# Patient Record
Sex: Male | Born: 1965 | Race: White | Hispanic: No | Marital: Married | Smoking: Never smoker
Health system: Southern US, Community
[De-identification: ages and names within clinical notes are randomized; demographics above are authoritative.]

## PROBLEM LIST (undated history)

## (undated) DIAGNOSIS — F419 Anxiety disorder, unspecified: Secondary | ICD-10-CM

## (undated) DIAGNOSIS — I10 Essential (primary) hypertension: Secondary | ICD-10-CM

## (undated) HISTORY — PX: HEMORRHOID SURGERY: SHX153

## (undated) HISTORY — PX: APPENDECTOMY: SHX54

## (undated) HISTORY — PX: HERNIA REPAIR: SHX51

---

## 2006-01-05 ENCOUNTER — Emergency Department: Payer: Self-pay | Admitting: Emergency Medicine

## 2006-02-04 ENCOUNTER — Emergency Department: Payer: Self-pay | Admitting: Emergency Medicine

## 2007-01-11 ENCOUNTER — Emergency Department: Payer: Self-pay | Admitting: Internal Medicine

## 2015-12-19 ENCOUNTER — Encounter: Payer: Self-pay | Admitting: Emergency Medicine

## 2015-12-19 DIAGNOSIS — W57XXXA Bitten or stung by nonvenomous insect and other nonvenomous arthropods, initial encounter: Secondary | ICD-10-CM | POA: Insufficient documentation

## 2015-12-19 DIAGNOSIS — L03011 Cellulitis of right finger: Secondary | ICD-10-CM | POA: Insufficient documentation

## 2015-12-19 DIAGNOSIS — Y9389 Activity, other specified: Secondary | ICD-10-CM | POA: Diagnosis not present

## 2015-12-19 DIAGNOSIS — S60464A Insect bite (nonvenomous) of right ring finger, initial encounter: Secondary | ICD-10-CM | POA: Diagnosis present

## 2015-12-19 DIAGNOSIS — Y990 Civilian activity done for income or pay: Secondary | ICD-10-CM | POA: Diagnosis not present

## 2015-12-19 DIAGNOSIS — Y929 Unspecified place or not applicable: Secondary | ICD-10-CM | POA: Insufficient documentation

## 2015-12-19 DIAGNOSIS — I10 Essential (primary) hypertension: Secondary | ICD-10-CM | POA: Insufficient documentation

## 2015-12-19 NOTE — ED Triage Notes (Addendum)
Patient ambulatory to triage with steady gait, without difficulty or distress noted; pt reports ?bite to right 4th finger; redness & swelling noted; st occurred while at work [employer]--workers comp profile indicates testing only upon request; pt accomp by supervisor Milas GainKunyealo Gray who st not testing required)

## 2015-12-20 ENCOUNTER — Emergency Department
Admission: EM | Admit: 2015-12-20 | Discharge: 2015-12-20 | Disposition: A | Discharge status: Home / Self Care | Payer: Worker's Compensation | Attending: Emergency Medicine | Admitting: Emergency Medicine

## 2015-12-20 DIAGNOSIS — L03011 Cellulitis of right finger: Secondary | ICD-10-CM

## 2015-12-20 DIAGNOSIS — W57XXXA Bitten or stung by nonvenomous insect and other nonvenomous arthropods, initial encounter: Secondary | ICD-10-CM

## 2015-12-20 HISTORY — DX: Essential (primary) hypertension: I10

## 2015-12-20 MED ORDER — CLINDAMYCIN HCL 300 MG PO CAPS: 300.0000 mg | ORAL_CAPSULE | Freq: Three times a day (TID) | ORAL | 0 refills | Status: AC

## 2015-12-20 MED ORDER — CLINDAMYCIN HCL 150 MG PO CAPS
300.0000 mg | ORAL_CAPSULE | Freq: Once | ORAL | Status: AC
  Administered 2015-12-20: 300 mg via ORAL
  Filled 2015-12-20: qty 2

## 2015-12-20 NOTE — Discharge Instructions (Signed)
Please follow up with your primary care physician and return with worsening redness, worsening swelling, fever or inability to take care of antibiotics.

## 2015-12-20 NOTE — ED Provider Notes (Signed)
[facility] Emergency Department Provider Note   ____________________________________________   First MD Initiated Contact with Patient 12/20/15 [phone]     (approximate)  I have reviewed the triage vital signs and the nursing notes.   HISTORY  Chief Complaint Insect Bite    HPI Benjamin Cooper is a 50 y.o. male who comes into the hospital today with redness to his right hand. The patient reports that he noticed that at work today. He also reports that he actually noticed it a little bit yesterday it was not this bad. The patient thinks he might of been bitten by a spider. The patient reports that it feels like his fingers broken although he didn't do anything to break his finger. The patient denies any trauma. 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 The patient reports though that the pain is not that bad and rates it a 1 out of 10 in intensity. The patient denies any fevers, nausea, vomiting. The patient came in from work for evaluation.   Past Medical History:  Diagnosis Date  . Hypertension     There are no active problems to display for this patient.   Past Surgical History:  Procedure Laterality Date  . APPENDECTOMY    . HEMORRHOID SURGERY    . HERNIA REPAIR      Prior to Admission medications   Medication Sig Start Date End Date Taking? Authorizing Provider  clindamycin (CLEOCIN) 300 MG capsule Take 1 capsule (300 mg total) by mouth 3 (three) times daily. 12/20/15 12/30/15  Rebecka ApleyAllison P Raydin Bielinski, MD    Allergies Patient has no known allergies.  No family history on file.  Social History Social History  Substance Use Topics  . Smoking status: Never Smoker  . Smokeless tobacco: Never Used  . Alcohol use No    Review of Systems Constitutional: No fever/chills Eyes: No visual changes. ENT: No sore throat. Cardiovascular: Denies chest pain. Respiratory: Denies shortness of breath. Gastrointestinal: No  abdominal pain.  No nausea, no vomiting.  No diarrhea.  No constipation. Genitourinary: Negative for dysuria. Musculoskeletal: Negative for back pain. Skin: Right hand swelling and redness Neurological: Negative for headaches, focal weakness or numbness.  10-point ROS otherwise negative.  ____________________________________________   PHYSICAL EXAM:  VITAL SIGNS: ED Triage Vitals  Enc Vitals Group     BP 12/19/15 2340 (!) 164/90     Pulse Rate 12/19/15 2340 87     Resp 12/19/15 2340 20     Temp 12/19/15 2340 97.5 F (36.4 C)     Temp Source 12/19/15 2340 Oral     SpO2 12/19/15 2340 100 %     Weight 12/19/15 2341 200 lb (90.7 kg)     Height 12/19/15 2341 5\' 8"  (1.727 m)     Head Circumference --      Peak Flow --      Pain Score 12/19/15 2340 2     Pain Loc --      Pain Edu? --      Excl. in GC? --     Constitutional: Alert and oriented. Well appearing and in Mild distress. Eyes: Conjunctivae are normal. PERRL. EOMI. Head: Atraumatic. Nose: No congestion/rhinnorhea. Mouth/Throat: Mucous membranes are moist.  Oropharynx non-erythematous. Cardiovascular: Normal rate, regular rhythm. Grossly normal heart sounds.  Good peripheral circulation. Respiratory: Normal respiratory effort.  No retractions. Lungs CTAB. Gastrointestinal: Soft and nontender. No distention.  Musculoskeletal: Redness to right proximal fourth phalanx and over  the MCPs. Some mild soft tissue swelling up to the fourth PIP. No distal swelling, no flexor tendon tenderness, no sausage digit. Range of motion intact. Abrasion noted to mid proximal phalanx Neurologic:  Normal speech and language.  Skin:  Redness to right fourth digit over the MCP and proximal phalanx. Psychiatric: Mood and affect are normal.   ____________________________________________   LABS (all labs ordered are listed, but only abnormal results are displayed)  Labs Reviewed - No data to  display ____________________________________________  EKG  none ____________________________________________  RADIOLOGY  none ____________________________________________   PROCEDURES  Procedure(s) performed: None  Procedures  Critical Care performed: No  ____________________________________________   INITIAL IMPRESSION / ASSESSMENT AND PLAN / ED COURSE  Pertinent labs & imaging results that were available during my care of the patient were reviewed by me and considered in my medical decision making (see chart for details).  This is a 50 year old male who comes into the hospital today with some redness over his right fourth digit. The patient has an abrasion to the mid proximal phalanx which is concerning for a bug bite. The patient thinks that he might have been bitten by a spider. He did not see anything at work and he reports that this did start yesterday. I will give the patient a dose of clindamycin and instructed him that should the swelling got worse or the redness get worse in the next few days he should return to the emergency Department. Otherwise the patient's pain is minimal at this time. He will be discharged home to follow-up with his primary care physician.  Clinical Course      ____________________________________________   FINAL CLINICAL IMPRESSION(S) / ED DIAGNOSES  Final diagnoses:  Cellulitis of finger of right hand  Insect bite, initial encounter      NEW MEDICATIONS STARTED DURING THIS VISIT:  New Prescriptions   CLINDAMYCIN (CLEOCIN) 300 MG CAPSULE    Take 1 capsule (300 mg total) by mouth 3 (three) times daily.     Note:  This document was prepared using Dragon voice recognition software and may include unintentional dictation errors.    Rebecka ApleyAllison P Ugochukwu Chichester, MD 12/20/15 [phone]

## 2015-12-20 NOTE — ED Notes (Signed)
See triage note...pt sts that he noticed itching at site yesterday, but stated swelling happened 11/13.  Pt sts "not sure what bit me".  Redness and swelling noted to area, redness radiates past R wrist.  Sensation, circulation, motor function intact.  NAD.  Pt denies CP, SOB, N/V/D, fevers or LOC.

## 2015-12-22 ENCOUNTER — Emergency Department
Admission: EM | Admit: 2015-12-22 | Discharge: 2015-12-22 | Disposition: A | Discharge status: Home / Self Care | Payer: Worker's Compensation | Attending: Student in an Organized Health Care Education/Training Program | Admitting: Student in an Organized Health Care Education/Training Program

## 2015-12-22 ENCOUNTER — Encounter: Payer: Self-pay | Admitting: Emergency Medicine

## 2015-12-22 ENCOUNTER — Ambulatory Visit (INDEPENDENT_AMBULATORY_CARE_PROVIDER_SITE_OTHER): Payer: Worker's Compensation

## 2015-12-22 ENCOUNTER — Encounter: Payer: Self-pay | Admitting: *Deleted

## 2015-12-22 ENCOUNTER — Ambulatory Visit
Admission: EM | Admit: 2015-12-22 | Discharge: 2015-12-22 | Disposition: A | Discharge status: Home / Self Care | Payer: Worker's Compensation | Attending: Family Medicine | Admitting: Family Medicine

## 2015-12-22 DIAGNOSIS — I10 Essential (primary) hypertension: Secondary | ICD-10-CM | POA: Insufficient documentation

## 2015-12-22 DIAGNOSIS — L0291 Cutaneous abscess, unspecified: Secondary | ICD-10-CM

## 2015-12-22 DIAGNOSIS — L0390 Cellulitis, unspecified: Secondary | ICD-10-CM | POA: Diagnosis not present

## 2015-12-22 DIAGNOSIS — L03011 Cellulitis of right finger: Secondary | ICD-10-CM | POA: Insufficient documentation

## 2015-12-22 DIAGNOSIS — M79644 Pain in right finger(s): Secondary | ICD-10-CM | POA: Diagnosis present

## 2015-12-22 MED ORDER — CEPHALEXIN 500 MG PO CAPS: 500.0000 mg | ORAL_CAPSULE | Freq: Three times a day (TID) | ORAL | 0 refills | Status: DC

## 2015-12-22 MED ORDER — IBUPROFEN 800 MG PO TABS: 800.0000 mg | ORAL_TABLET | Freq: Three times a day (TID) | ORAL | 0 refills | Status: DC | PRN

## 2015-12-22 NOTE — ED Notes (Signed)
This RN spoke with Jami, PA-C, states okay to come back to flex.

## 2015-12-22 NOTE — ED Triage Notes (Signed)
Right ring finger, possible insect bite / abcess x several days. Site is deep red with significant edema.

## 2015-12-22 NOTE — ED Triage Notes (Signed)
Pt presents to ED with c/o worsening of his R 4th finger. Pt was seen here approx 2 days ago and was dx with cellulitis and was sent home for prescription for Clindamycin. Pt states finger is looking better than it did a few days ago. Pt states went to Urgent Care today and was told he had an abscess forming on his R 4th finger. Pt states has already filed worker's comp for this injury.

## 2015-12-22 NOTE — ED Notes (Signed)
Right ring finger red and swollen  States his hand was swollen 2 days ago

## 2015-12-22 NOTE — ED Provider Notes (Signed)
[facility] ____________________________________________  Time seen: Approximately 4:04 PM  I have reviewed the triage vital signs and the nursing notes.   HISTORY  Chief Complaint Abscess   HPI Benjamin Cooper is a 50 y.o. male presenting for the follow-up of redness and swelling to his right fourth finger. Patient reports that this past Monday he was at work and noticed an area to his right fourth finger that may have been an insect bite, with some redness that quickly progressed. Patient reports that he was seen in the emergency room Monday night and was started on antibiotics. Patient reports that he has been taking the antibiotics since being seen, but reports continued redness and swelling to the right fourth finger. Patient states his work wanted him to be seen in urgent care today for follow-up as this is being considered a workers Management consultantcompensation injury.  Patient states the pain is mild at this time. Patient reports tightness and discomfort and right proximal fourth finger. Patient denies any direct injury or,, but reports he does lift a lot of boxes daily at work. Denies any numbness or paresthesias. Reports some discomfort when trying to make a full fist. Reports right-handed. Patient states that the swelling overall has improved, further stating that he no longer has swelling in his hand, but reports the right fourth finger swelling has persisted.  Denies accompanying fevers, other skin changes, vomiting, nausea, diarrhea, weakness or other complaints. Patient reports otherwise feels well.  States he thinks tetanus immunization was updated in ER.      Past Medical History:  Diagnosis Date  . Hypertension     There are no active problems to display for this patient.   Past Surgical History:  Procedure Laterality Date  . APPENDECTOMY    . HEMORRHOID SURGERY    . HERNIA REPAIR      Current Outpatient Rx  . Order #: [id number] Class: Print  . Order #:  [id number] Class: Print  . Order #: [id number] Class: Print    No current facility-administered medications for this encounter.   Current Outpatient Prescriptions:  .  cephALEXin (KEFLEX) 500 MG capsule, Take 1 capsule (500 mg total) by mouth 3 (three) times daily., Disp: 21 capsule, Rfl: 0 .  clindamycin (CLEOCIN) 300 MG capsule, Take 1 capsule (300 mg total) by mouth 3 (three) times daily., Disp: 30 capsule, Rfl: 0 .  ibuprofen (ADVIL,MOTRIN) 800 MG tablet, Take 1 tablet (800 mg total) by mouth every 8 (eight) hours as needed (pain)., Disp: 30 tablet, Rfl: 0  Allergies Patient has no known allergies.  History reviewed. No pertinent family history.  Social History Social History  Substance Use Topics  . Smoking status: Never Smoker  . Smokeless tobacco: Never Used  . Alcohol use No    Review of Systems Constitutional: No fever/chills Eyes: No visual changes. ENT: No sore throat. Cardiovascular: Denies chest pain. Respiratory: Denies shortness of breath. Gastrointestinal: No abdominal pain.  No nausea, no vomiting.  No diarrhea.  No constipation. Genitourinary: Negative for dysuria. Musculoskeletal: Negative for back pain. Skin: Negative for rash.As above. Neurological: Negative for headaches, focal weakness or numbness.  10-point ROS otherwise negative.  ____________________________________________   PHYSICAL EXAM:  VITAL SIGNS: ED Triage Vitals  Enc Vitals Group     BP 12/22/15 1527 132/87     Pulse Rate 12/22/15 1527 74     Resp 12/22/15 1527 16     Temp 12/22/15 1527 99.2 F (37.3 C)     Temp Source 12/22/15 1527 Oral  SpO2 12/22/15 1527 99 %     Weight 12/22/15 1528 189 lb (85.7 kg)     Height 12/22/15 1528 5\' 7"  (1.702 m)     Head Circumference --      Peak Flow --      Pain Score --      Pain Loc --      Pain Edu? --      Excl. in GC? --     Constitutional: Alert and oriented. Well appearing and in no acute distress. Eyes: Conjunctivae are normal.  PERRL. EOMI. ENT      Head: Normocephalic and atraumatic.      Mouth/Throat: Mucous membranes are moist. Cardiovascular: Normal rate, regular rhythm. Grossly normal heart sounds.  Good peripheral circulation. Respiratory: Normal respiratory effort without tachypnea nor retractions. Breath sounds are clear and equal bilaterally. No wheezes/rales/rhonchi.. Musculoskeletal:  Ambulatory with steady gait. No midline cervical, thoracic or lumbar tenderness to palpation.  Neurologic:  Normal speech and language. No gross focal neurologic deficits are appreciated. Speech is normal. No gait instability.  Skin:  Skin is warm, dry and intact. No rash noted. Except: Right fourth digit proximal phalanx with moderate erythema, moderate dorsal swelling, superficial pustule noted, no fluctuance, no active drainage, normal distal sensation and capillary refill, slightly weakened right fourth digit flexion and extension but full range of motion present, right hand otherwise nontender. Psychiatric: Mood and affect are normal. Speech and behavior are normal. Patient exhibits appropriate insight and judgment   ___________________________________________   LABS (all labs ordered are listed, but only abnormal results are displayed)  Labs Reviewed - No data to display ____________________________________________  RADIOLOGY  Dg Finger Ring Right  Result Date: 12/22/2015 CLINICAL DATA:  50 y/o  M; insect bite and abscess for several days. EXAM: RIGHT RING FINGER 2+V COMPARISON:  None. FINDINGS: There is no evidence of fracture or dislocation. There is no evidence of arthropathy or other focal bone abnormality. Soft tissue swelling about the proximal fourth digit. IMPRESSION: No evidence of arthropathy or other focal bone abnormality. Electronically Signed   By: Mitzi Hansen M.D.   On: 12/22/2015 16:40   ____________________________________________   PROCEDURES Procedures    INITIAL IMPRESSION /  ASSESSMENT AND PLAN / ED COURSE  Pertinent labs & imaging results that were available during my care of the patient were reviewed by me and considered in my medical decision making (see chart for details).  Overall well-appearing patient. Patient not best historian. Presents for follow-up of redness and swelling to right proximal fourth digit. Unknown if trauma versus break in skin versus insect bite initiating cellulitis. Seen in the emergency room and had initial treatment. Patient taking clindamycin for 48 hours now with continued redness and swelling. Discussed in detail with patient as concerned for persisted cellulitis as well as with slightly weakened flexion and extension, patient may need to be seen  IV antibiotics for continued treatment.  In comparing patient presentation with ER physicians description of wound, concern for no improvement. Also in reviewing the x-ray appearance of subcutaneous gas present, but not mentioned by radiologist. As concerned for failed outpatient therapy, recommended for patient to be further seen in the emergency room at this time. Information for given to patient for Tommie Maximiano Coss for further follow-up post ER visit.  Discussed follow up with Primary care physician this week. Discussed follow up and return parameters including no resolution or any worsening concerns. Patient verbalized understanding and agreed to plan.   Discussed patient and  plan of care with Dr Thurmond ButtsWade who also saw patient and agrees with plan of care.   ____________________________________________   FINAL CLINICAL IMPRESSION(S) / ED DIAGNOSES  Final diagnoses:  Cellulitis, unspecified cellulitis site  Abscess     Discharge Medication List as of 12/22/2015  4:50 PM      Note: This dictation was prepared with Dragon dictation along with smaller phrase technology. Any transcriptional errors that result from this process are unintentional.    Clinical Course       Renford DillsLindsey  Jeniffer Culliver, NP 12/22/15 1812

## 2015-12-22 NOTE — Discharge Instructions (Signed)
Go directly to emergency room as discussed.   Then follow up with Benjamin Cooper as directed. Call to schedule.

## 2015-12-22 NOTE — ED Provider Notes (Signed)
[facility] Emergency Department Provider Note  ____________________________________________  Time seen: Approximately 6:09 PM  I have reviewed the triage vital signs and the nursing notes.   HISTORY  Chief Complaint Hand Pain    HPI Benjamin Cooper is a 50 y.o. male , NAD, presents to the emergency department for follow-up of cellulitis. Patient states he was seen in this emergency department a few days ago for cellulitis affecting his right fourth finger, right hand and wrist. States since that time he has been taking his oral clindamycin as prescribed and notes decreased pain, swelling and redness. Also notes increased range of motion of the right wrist, hand and fingers since being on antibiotics. States he went to a local urgent care today for wound check in which x-rays were completed. He was told that an abscess was seen on the x-ray and was referred to this emergency department for follow-up. Patient denies any chest pain, shortness breath, abdominal pain, vomiting. Does state he gets nauseous if he takes his antibiotic without eating food with it. Has had no fevers, chills, body aches. Oozing, weeping or bleeding.   Past Medical History:  Diagnosis Date  . Hypertension     There are no active problems to display for this patient.   Past Surgical History:  Procedure Laterality Date  . APPENDECTOMY    . HEMORRHOID SURGERY    . HERNIA REPAIR      Prior to Admission medications   Medication Sig Start Date End Date Taking? Authorizing Provider  cephALEXin (KEFLEX) 500 MG capsule Take 1 capsule (500 mg total) by mouth 3 (three) times daily. 12/22/15   Jami L Hagler, PA-C  clindamycin (CLEOCIN) 300 MG capsule Take 1 capsule (300 mg total) by mouth 3 (three) times daily. 12/20/15 12/30/15  Rebecka Apley, MD  ibuprofen (ADVIL,MOTRIN) 800 MG tablet Take 1 tablet (800 mg total) by mouth every 8 (eight) hours as needed (pain). 12/22/15   Jami L Hagler,  PA-C    Allergies Patient has no known allergies.  History reviewed. No pertinent family history.  Social History Social History  Substance Use Topics  . Smoking status: Never Smoker  . Smokeless tobacco: Never Used  . Alcohol use No     Review of Systems  Constitutional: No fever/chills Cardiovascular: No chest pain. Respiratory: No shortness of breath. No wheezing.  Gastrointestinal: No abdominal pain.  No nausea, vomiting.  Musculoskeletal: Negative for Right wrist, hand or finger pain.  Skin: Positive redness and swelling and skin sore about the right ring finger. Negative for rash, oozing, weeping, bleeding. Neurological: Negative for numbness, weakness, tingling. 10-point ROS otherwise negative.  ____________________________________________   PHYSICAL EXAM:  VITAL SIGNS: ED Triage Vitals  Enc Vitals Group     BP 12/22/15 1741 (!) 142/89     Pulse Rate 12/22/15 1741 72     Resp 12/22/15 1741 18     Temp 12/22/15 1741 97.6 F (36.4 C)     Temp Source 12/22/15 1741 Oral     SpO2 12/22/15 1741 99 %     Weight 12/22/15 1742 200 lb (90.7 kg)     Height 12/22/15 1742 5\' 8"  (1.727 m)     Head Circumference --      Peak Flow --      Pain Score 12/22/15 1746 1     Pain Loc --      Pain Edu? --      Excl. in GC? --  Constitutional: Alert and oriented. Well appearing and in no acute distress. Eyes: Conjunctivae are normal Without icterus or injection Head: Atraumatic. Cardiovascular: Good peripheral circulation with 2+ pulses noted in the right upper extremity. Capillary refill is brisk in all digits of the right hand. Respiratory: Normal respiratory effort without tachypnea or retractions.  Musculoskeletal: Full range of motion of the right wrist, hand and fingers without difficulty or pain. Patient is able to make a fist and fully extend all digits without pain or difficulty. Neurologic:  Normal speech and language. No gross focal neurologic deficits are  appreciated.  Skin:  2 cm oblong area of erythematous and edematous tissue noted about the dorsal portion of the right fourth proximal phalanx with area of pale skin centrally. 2 mm annular scab is noted in this area. No active oozing, weeping or drainage. No pain to palpation of this area. No induration or fluctuance is noted. Skin is warm, dry and intact. No rash noted. Psychiatric: Mood and affect are normal. Speech and behavior are normal. Patient exhibits appropriate insight and judgement.   ____________________________________________   LABS  None ____________________________________________  EKG  None ____________________________________________  RADIOLOGY I, Hope PigeonJami L Hagler, personally viewed and evaluated these images (plain radiographs) as part of my medical decision making, as well as reviewing the written report by the radiologist.  Dg Finger Ring Right  Result Date: 12/22/2015 CLINICAL DATA:  50 y/o  M; insect bite and abscess for several days. EXAM: RIGHT RING FINGER 2+V COMPARISON:  None. FINDINGS: There is no evidence of fracture or dislocation. There is no evidence of arthropathy or other focal bone abnormality. Soft tissue swelling about the proximal fourth digit. IMPRESSION: No evidence of arthropathy or other focal bone abnormality. Electronically Signed   By: Mitzi HansenLance  Furusawa-Stratton M.D.   On: 12/22/2015 16:40   X-ray of the right ring finger above was completed at the most discomfort in urgent care. I personally evaluated the images and note no evidence of subcutaneous gas or abscess formation. Agree with radiology over read of soft tissue swelling and no arthropathy or other bone abnormality. ____________________________________________    PROCEDURES  Procedure(s) performed: None   Procedures   Medications - No data to display   ____________________________________________   INITIAL IMPRESSION / ASSESSMENT AND PLAN / ED COURSE  Pertinent labs &  imaging results that were available during my care of the patient were reviewed by me and considered in my medical decision making (see chart for details).  Clinical Course     Patient's diagnosis is consistent with cellulitis of right ring finger. Patient's cellulitis is vastly improved since his initial visit in this emergency department. No x-ray nor physical exam evidence of abscess is noted at this time. Patient will be discharged home with prescriptions for Keflex and ibuprofen to take along with his current prescription of clindamycin to ensure coverage of all potential bacteria causing the cellulitis. Patient is to follow up with patient advised to follow-up with his primary care provider or the urgent care of his choice for wound recheck in 2-3 days. Patient is also reminded to take all antibiotics with food to avoid any nausea or vomiting. Patient is given ED precautions to return to the ED for any worsening or new symptoms.    ____________________________________________  FINAL CLINICAL IMPRESSION(S) / ED DIAGNOSES  Final diagnoses:  Cellulitis of right ring finger      NEW MEDICATIONS STARTED DURING THIS VISIT:  New Prescriptions   CEPHALEXIN (KEFLEX) 500 MG CAPSULE  Take 1 capsule (500 mg total) by mouth 3 (three) times daily.   IBUPROFEN (ADVIL,MOTRIN) 800 MG TABLET    Take 1 tablet (800 mg total) by mouth every 8 (eight) hours as needed (pain).         Hope PigeonJami L Hagler, PA-C 12/22/15 1823    Willy EddyPatrick Robinson, MD 12/22/15 2110

## 2016-03-18 ENCOUNTER — Ambulatory Visit
Admission: EM | Admit: 2016-03-18 | Discharge: 2016-03-18 | Disposition: A | Discharge status: Home / Self Care | Payer: BLUE CROSS/BLUE SHIELD | Attending: Family Medicine | Admitting: Family Medicine

## 2016-03-18 ENCOUNTER — Encounter: Payer: Self-pay | Admitting: Gynecology

## 2016-03-18 DIAGNOSIS — L02512 Cutaneous abscess of left hand: Secondary | ICD-10-CM | POA: Diagnosis not present

## 2016-03-18 HISTORY — DX: Anxiety disorder, unspecified: F41.9

## 2016-03-18 MED ORDER — MUPIROCIN 2 % EX OINT: 1.0000 "application " | TOPICAL_OINTMENT | Freq: Three times a day (TID) | CUTANEOUS | 0 refills | Status: DC

## 2016-03-18 MED ORDER — SULFAMETHOXAZOLE-TRIMETHOPRIM 800-160 MG PO TABS: 1.0000 | ORAL_TABLET | Freq: Two times a day (BID) | ORAL | 0 refills | Status: DC

## 2016-03-18 NOTE — ED Provider Notes (Signed)
[facility]    CSN: [id number] Arrival date & time: 03/18/16  1333     History   Chief Complaint Chief Complaint  Patient presents with  . Abscess    HPI Benjamin Cooper is a 51 y.o. male.   Patient's 51 year old white male who is here because of infected left ring finger. States he thinks it became infected with using certain chemicals he is actually sure when it started but he thinks it started on Wednesday. He was unable to work on Saturday informed me that he came here but we will close apparently came after closing hours and if he thinks was about 5 or 6:00 when he got here. He's had pain in the left index finger and states is swollen. 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 Past smoker history has a history of anxiety and hypertension. He's had previous appendectomy hernia repair and hemorrhoid surgery. No known drug allergies. He has a son who also being treated for suspected MRSA infection as well. All the pertinent family medical history relevant to today's visit.   The history is provided by the patient. No language interpreter was used.  Abscess  Location:  Finger Finger abscess location:  L ring finger Abscess quality: fluctuance, induration, painful and redness   Red streaking: yes   Progression:  Worsening Pain details:    Quality:  Pressure, throbbing and sharp   Severity:  Moderate   Timing:  Constant   Progression:  Worsening Chronicity:  New Relieved by:  Nothing Worsened by:  Nothing   Past Medical History:  Diagnosis Date  . Anxiety   . Hypertension     There are no active problems to display for this patient.   Past Surgical History:  Procedure Laterality Date  . APPENDECTOMY    . HEMORRHOID SURGERY    . HERNIA REPAIR         Home Medications    Prior to Admission medications   Medication Sig Start Date End Date Taking? Authorizing Provider  ibuprofen (ADVIL,MOTRIN) 800 MG tablet Take 1 tablet (800 mg total) by mouth every 8 (eight) hours  as needed (pain). 12/22/15  Yes Jami L Hagler, PA-C  metoprolol succinate (TOPROL-XL) 50 MG 24 hr tablet Take 50 mg by mouth daily. Take with or immediately following a meal.   Yes Historical Provider, MD  PARoxetine (PAXIL) 20 MG tablet Take 20 mg by mouth daily.   Yes Historical Provider, MD  cephALEXin (KEFLEX) 500 MG capsule Take 1 capsule (500 mg total) by mouth 3 (three) times daily. 12/22/15   Jami L Hagler, PA-C  mupirocin ointment (BACTROBAN) 2 % Apply 1 application topically 3 (three) times daily. 03/18/16   Hassan Rowan, MD  sulfamethoxazole-trimethoprim (BACTRIM DS,SEPTRA DS) 800-160 MG tablet Take 1 tablet by mouth 2 (two) times daily. 03/18/16   Hassan Rowan, MD    Family History No family history on file.  Social History Social History  Substance Use Topics  . Smoking status: Never Smoker  . Smokeless tobacco: Never Used  . Alcohol use No     Allergies   Patient has no known allergies.   Review of Systems Review of Systems  Musculoskeletal: Positive for joint swelling.  Skin: Positive for color change and rash.  All other systems reviewed and are negative.    Physical Exam Triage Vital Signs ED Triage Vitals  Enc Vitals Group     BP 03/18/16 1440 132/80     Pulse Rate 03/18/16 1440 71  Resp 03/18/16 1440 16     Temp 03/18/16 1440 98.6 F (37 C)     Temp Source 03/18/16 1440 Oral     SpO2 03/18/16 1440 99 %     Weight 03/18/16 1443 189 lb (85.7 kg)     Height 03/18/16 1443 5\' 10"  (1.778 m)     Head Circumference --      Peak Flow --      Pain Score 03/18/16 1444 2     Pain Loc --      Pain Edu? --      Excl. in GC? --    No data found.   Updated Vital Signs BP 132/80 (BP Location: Left Arm)   Pulse 71   Temp 98.6 F (37 C) (Oral)   Resp 16   Ht 5\' 10"  (1.778 m)   Wt 189 lb (85.7 kg)   SpO2 99%   BMI 27.12 kg/m   Visual Acuity Right Eye Distance:   Left Eye Distance:   Bilateral Distance:    Right Eye Near:   Left Eye Near:      Bilateral Near:     Physical Exam  Constitutional: He is oriented to person, place, and time. He appears well-developed and well-nourished.  HENT:  Head: Normocephalic and atraumatic.  Eyes: Pupils are equal, round, and reactive to light.  Neck: Normal range of motion.  Pulmonary/Chest: Effort normal.  Musculoskeletal: Normal range of motion.  Neurological: He is alert and oriented to person, place, and time.  Skin: Rash noted. There is erythema.     Posterior left ring finger swollen red and inflamed. With some gentle pressure abscess opened up and copious bile drainage was elicited. Culture was obtained of the wound.  Psychiatric: He has a normal mood and affect.     UC Treatments / Results  Labs (all labs ordered are listed, but only abnormal results are displayed) Labs Reviewed  AEROBIC CULTURE (SUPERFICIAL SPECIMEN)    EKG  EKG Interpretation None       Radiology No results found.  Procedures Procedures (including critical care time)  Medications Ordered in UC Medications - No data to display   Initial Impression / Assessment and Plan / UC Course  I have reviewed the triage vital signs and the nursing notes.  Pertinent labs & imaging results that were available during my care of the patient were reviewed by me and considered in my medical decision making (see chart for details).    Place patient on Septra DS 1 tablet twice a day and Bactroban ointment to apply 2-3 times a day to the left ring finger. Giving him a work note for either Monday and or Monday and Tuesday dictating out tender finger. I strongly suggest return Wednesday for recheck and to be reevaluated but it finger does get worse I will also [facility] because he may need to see a hand surgeon if is getting worse to better explain to him that I'm not going to cut open a finger but since it successfully drained with some pressure will see if this will take care of things.  Final  Clinical Impressions(s) / UC Diagnoses   Final diagnoses:  Abscess of left ring finger    New Prescriptions New Prescriptions   MUPIROCIN OINTMENT (BACTROBAN) 2 %    Apply 1 application topically 3 (three) times daily.   SULFAMETHOXAZOLE-TRIMETHOPRIM (BACTRIM DS,SEPTRA DS) 800-160 MG TABLET    Take 1 tablet by mouth 2 (two)  times daily.    Note: This dictation was prepared with Dragon dictation along with smaller phrase technology. Any transcriptional errors that result from this process are unintentional.   Hassan Rowan, MD 03/18/16 [phone]

## 2016-03-18 NOTE — ED Triage Notes (Signed)
Patient with left ring finger abscess x 2 days ago.

## 2016-03-21 ENCOUNTER — Ambulatory Visit
Admission: EM | Admit: 2016-03-21 | Discharge: 2016-03-21 | Disposition: A | Discharge status: Home / Self Care | Payer: BLUE CROSS/BLUE SHIELD | Attending: Family Medicine | Admitting: Family Medicine

## 2016-03-21 DIAGNOSIS — L03012 Cellulitis of left finger: Secondary | ICD-10-CM

## 2016-03-21 LAB — AEROBIC CULTURE  (SUPERFICIAL SPECIMEN)

## 2016-03-21 LAB — AEROBIC CULTURE W GRAM STAIN (SUPERFICIAL SPECIMEN)

## 2016-03-21 NOTE — ED Triage Notes (Signed)
Patient complains of left ring finger abscess. Patient states that the area is looking better to him. Patient still has swelling and redness.

## 2016-03-21 NOTE — Discharge Instructions (Signed)
Continue oral and topical medication as prescribed. Rest. Drink plenty of fluids.Keep clean. Monitor closely.    Follow up with your primary care physician this week as needed. Return to Urgent care or Emergency room for increased redness, swelling, pain, new or worsening concerns.

## 2016-03-21 NOTE — ED Provider Notes (Signed)
[facility] ____________________________________________  Time seen: Approximately 08:40 AM  I have reviewed the triage vital signs and the nursing notes.   HISTORY  Chief Complaint Abscess (recheck)   HPI Benjamin Cooper is a 51 y.o. male  presenting for reevaluation of his abscess to left ring finger. Patient reports seen in urgent care 3 days ago for abscess. Patient states he was started on oral antibiotics as well as topical and has been taking them as prescribed. Patient reports tolerating antibiotics well. Patient reports he feels like the abscess has much improved. Patient denies any more drainage. States that the redness and swelling are much improved. Denies any paresthesias, decreased range of motion, weakness, other skin changes or pain. Denies fevers. Reports continues to eat and drink well. Patient reports he has had a history of similar in the past. 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  Denies chest pain, shortness of breath, abdominal pain, dysuria, extremity pain, extremity swelling or rash. Denies recent sickness. Denies recent antibiotic use. Reports tetanus immunization up-to-date.  [facility]: PCP   Past Medical History:  Diagnosis Date  . Anxiety   . Hypertension     There are no active problems to display for this patient.   Past Surgical History:  Procedure Laterality Date  . APPENDECTOMY    . HEMORRHOID SURGERY    . HERNIA REPAIR       No current facility-administered medications for this encounter.   Current Outpatient Prescriptions:  .  ibuprofen (ADVIL,MOTRIN) 800 MG tablet, Take 1 tablet (800 mg total) by mouth every 8 (eight) hours as needed (pain)., Disp: 30 tablet, Rfl: 0 .  metoprolol succinate (TOPROL-XL) 50 MG 24 hr tablet, Take 50 mg by mouth daily. Take with or immediately following a meal., Disp: ,  Rfl:  .  mupirocin ointment (BACTROBAN) 2 %, Apply 1 application topically 3 (three) times daily., Disp: 22 g, Rfl: 0 .  PARoxetine (PAXIL) 20 MG tablet, Take 20 mg by mouth daily., Disp: , Rfl:  .  sulfamethoxazole-trimethoprim (BACTRIM DS,SEPTRA DS) 800-160 MG tablet, Take 1 tablet by mouth 2 (two) times daily., Disp: 20 tablet, Rfl: 0 .  cephALEXin (KEFLEX) 500 MG capsule, Take 1 capsule (500 mg total) by mouth 3 (three) times daily., Disp: 21 capsule, Rfl: 0  Allergies Patient has no known allergies.  History reviewed. No pertinent family history.  Social History Social History  Substance Use Topics  . Smoking status: Never Smoker  . Smokeless tobacco: Never Used  . Alcohol use No    Review of Systems Constitutional: No fever/chills Eyes: No visual changes. ENT: No sore throat. Cardiovascular: Denies chest pain. Respiratory: Denies shortness of breath. Gastrointestinal: No abdominal pain.  No nausea, no vomiting.  No diarrhea.  No constipation. Genitourinary: Negative for dysuria. Musculoskeletal: Negative for back pain. Skin: Negative for rash. As above. Neurological: Negative for  focal weakness or numbness.  10-point ROS otherwise negative.  ____________________________________________   PHYSICAL EXAM:  VITAL SIGNS: ED Triage Vitals  Enc Vitals Group     BP 03/21/16 0828 (!) 132/91     Pulse Rate 03/21/16 0828 61     Resp 03/21/16 0828 18     Temp 03/21/16 0828 97.6 F (36.4 C)     Temp Source 03/21/16 0828 Oral     SpO2 03/21/16 0828 100 %     Weight 03/21/16 0826 189  lb (85.7 kg)     Height 03/21/16 0826 5\' 10"  (1.778 m)     Head Circumference --      Peak Flow --      Pain Score 03/21/16 0827 0     Pain Loc --      Pain Edu? --      Excl. in GC? --     Constitutional: Alert and oriented. Well appearing and in no acute distress. Eyes: Conjunctivae are normal. PERRL. EOMI. ENT      Mouth/Throat: Mucous membranes are moist. Cardiovascular: Normal  rate, regular rhythm. Grossly normal heart sounds.  Good peripheral circulation. Respiratory: Normal respiratory effort without tachypnea nor retractions. Breath sounds are clear and equal bilaterally. No wheezes, rales, rhonchi. Musculoskeletal:  Ambulatory with steady gait. No midline cervical, thoracic or lumbar tenderness to palpation. Neurologic:  Normal speech and language. Speech is normal. No gait instability.  Skin:  Skin is warm, dry and intact. No rash noted. Except: Left hand dorsal aspect of proximal phalanx ring finger area of mild erythema and minimal swelling, no fluctuance, no induration, non-circumferential, no pointing abscess, no drainage, some dry skin, full range of motion present, no motor or tendon deficit noted to left fourth digit, bilateral hand grips strong and equal, left hand with normal distal sensation and capillary refill time to all fingers. Psychiatric: Mood and affect are normal. Speech and behavior are normal. Patient exhibits appropriate insight and judgment   ___________________________________________   LABS (all labs ordered are listed, but only abnormal results are displayed)  Labs Reviewed - No data to display   PROCEDURES Procedures    INITIAL IMPRESSION / ASSESSMENT AND PLAN / ED COURSE  Pertinent labs & imaging results that were available during my care of the patient were reviewed by me and considered in my medical decision making (see chart for details).  Well appearing patient. No acute distress. Presents for recheck of left ring finger abscess. No palpable abscess,  appearance of cellulitis at this time. Patient states he still has 7 days left of antibiotics. Continue antibiotic prescription as prescribed. Discussed keeping clean and applying topical antibiotic. Discussed continued monitoring and follow-up for any increased redness, swelling, drainage, new or worsening concerns.  Discussed follow up with Primary care physician this week.  Discussed follow up and return parameters including no resolution or any worsening concerns. Patient verbalized understanding and agreed to plan.   ____________________________________________   FINAL CLINICAL IMPRESSION(S) / ED DIAGNOSES  Final diagnoses:  Cellulitis of left ring finger     Discharge Medication List as of 03/21/2016  9:04 AM      Note: This dictation was prepared with Dragon dictation along with smaller phrase technology. Any transcriptional errors that result from this process are unintentional.         Renford DillsLindsey Arnetra Terris, NP 03/21/16 1042

## 2017-03-11 ENCOUNTER — Other Ambulatory Visit: Payer: Self-pay

## 2017-03-11 ENCOUNTER — Ambulatory Visit
Admission: EM | Admit: 2017-03-11 | Discharge: 2017-03-11 | Disposition: A | Discharge status: Home / Self Care | Payer: BLUE CROSS/BLUE SHIELD | Attending: Family Medicine | Admitting: Family Medicine

## 2017-03-11 ENCOUNTER — Encounter: Payer: Self-pay | Admitting: Emergency Medicine

## 2017-03-11 DIAGNOSIS — L0291 Cutaneous abscess, unspecified: Secondary | ICD-10-CM

## 2017-03-11 DIAGNOSIS — L02211 Cutaneous abscess of abdominal wall: Secondary | ICD-10-CM

## 2017-03-11 MED ORDER — SULFAMETHOXAZOLE-TRIMETHOPRIM 800-160 MG PO TABS: 1.0000 | ORAL_TABLET | Freq: Two times a day (BID) | ORAL | 0 refills | Status: AC

## 2017-03-11 NOTE — ED Triage Notes (Signed)
Patient in tonight c/o abscess on lower abdomin x 4 days. Patient states it opened up 2 days ago and started draining. Patient denies fever. Patient has been using Neosporin and Peroxide.

## 2017-03-11 NOTE — Discharge Instructions (Signed)
-  Bactrim: one tablet twice a day for 7 days -warm compresses -keep clean -ibuprofen or Tylenol for pain

## 2017-03-11 NOTE — ED Provider Notes (Signed)
[facility]    CSN: [id number] Arrival date & time: 03/11/17  1928     History   Chief Complaint Chief Complaint  Patient presents with  . Abscess    HPI Benjamin Cooper is a 52 y.o. male.   Patient is a 52 year old male who presents with complaint of an abscess to his lower abdomen that was noted first 4 days ago.  Patient states it opened up 2 days ago and started draining.  Patient states initially had some pus drainage and now mostly bloody drainage.  He has been treating it with peroxide and Neosporin.  Patient denies any fever or shortness of breath.  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      Past Medical History:  Diagnosis Date  . Anxiety   . Hypertension     There are no active problems to display for this patient.   Past Surgical History:  Procedure Laterality Date  . APPENDECTOMY    . HEMORRHOID SURGERY    . HERNIA REPAIR        Home Medications    Prior to Admission medications   Medication Sig Start Date End Date Taking? Authorizing Provider  metoprolol succinate (TOPROL-XL) 50 MG 24 hr tablet Take 50 mg by mouth daily. Take with or immediately following a meal.   Yes [provider]  PARoxetine (PAXIL) 20 MG tablet Take 20 mg by mouth daily.   Yes [provider]  sulfamethoxazole-trimethoprim (BACTRIM DS,SEPTRA DS) 800-160 MG tablet Take 1 tablet by mouth 2 (two) times daily for 7 days. 03/11/17 03/18/17  Candis SchatzHarris, Michael D, PA-C    Family History Family History  Problem Relation Age of Onset  . Hypertension Mother   . COPD Father     Social History Social History   Tobacco Use  . Smoking status: Never Smoker  . Smokeless tobacco: Never Used  Substance Use Topics  . Alcohol use: No  . Drug use: No     Allergies   Patient has no known allergies.   Review of Systems Review  of Systems  As noted above in HPI.  Other pertinent systems reviewed and found to be negative   Physical Exam Triage Vital Signs ED Triage Vitals  Enc Vitals Group     BP 03/11/17 1955 136/78     Pulse Rate 03/11/17 1955 73     Resp 03/11/17 1955 16     Temp 03/11/17 1955 99.3 F (37.4 C)     Temp Source 03/11/17 1955 Oral     SpO2 03/11/17 1955 99 %     Weight 03/11/17 1956 180 lb (81.6 kg)     Height 03/11/17 1956 5\' 8"  (1.727 m)     Head Circumference --      Peak Flow --      Pain Score 03/11/17 1956 0     Pain Loc --      Pain Edu? --      Excl. in GC? --    No data found.  Updated Vital Signs BP 136/78 (BP Location: Left Arm)   Pulse 73   Temp 99.3 F (37.4 C) (Oral)   Resp 16   Ht 5\' 8"  (1.727 m)   Wt 180 lb (81.6 kg)   SpO2 99%   BMI 27.37 kg/m   Physical Exam  Constitutional: He is  oriented to person, place, and time. He appears well-developed and well-nourished. No distress.  Pulmonary/Chest: Breath sounds normal.  Neurological: He is alert and oriented to person, place, and time. No cranial nerve deficit.  Skin: Skin is warm and dry. There is erythema.        UC Treatments / Results  Labs (all labs ordered are listed, but only abnormal results are displayed) Labs Reviewed - No data to display  EKG  EKG Interpretation None       Radiology No results found.  Procedures Procedures (including critical care time)  Medications Ordered in UC Medications - No data to display   Initial Impression / Assessment and Plan / UC Course  I have reviewed the triage vital signs and the nursing notes.  Pertinent labs & imaging results that were available during my care of the patient were reviewed by me and considered in my medical decision making (see chart for details).     Patient with abscess to lower abdomen just above the pubic area.  Redness with minimal induration.  Bloody drainage on expression.  Final Clinical Impressions(s) / UC  Diagnoses   Final diagnoses:  Abscess  Abscess of skin of abdomen   Will give patient prescription for Bactrim.  Advised warm compresses and keep area clean.  Ibuprofen or Tylenol for pain.   ED Discharge Orders        Ordered    sulfamethoxazole-trimethoprim (BACTRIM DS,SEPTRA DS) 800-160 MG tablet  2 times daily     03/11/17 2037       Controlled Substance Prescriptions [place] Controlled Substance Registry consulted? Not Applicable   Candis Schatz, PA-C 03/11/17 2038

## 2017-03-14 ENCOUNTER — Telehealth: Payer: Self-pay

## 2017-03-14 NOTE — Telephone Encounter (Signed)
Called to follow up with patient since visit here at [facility]. Spoke with pt. Patient reports improvement. Patient instructed to call back with any questions or concerns. MAH  

## 2018-06-26 IMAGING — CR DG FINGER RING 2+V*R*
3 series · 3 of 3 positions shown · non-contrast
Comparison: None.

CLINICAL DATA: 50 y/o  M; insect bite and abscess for several days.

EXAM:
RIGHT RING FINGER 2+V

[finger ap]
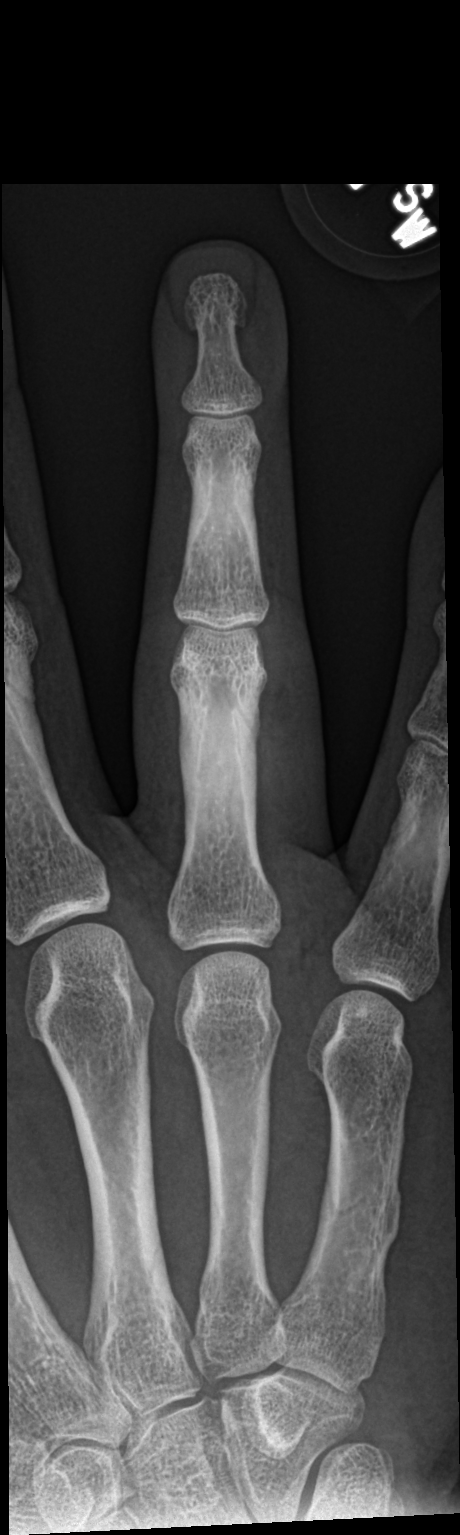

[finger obl]
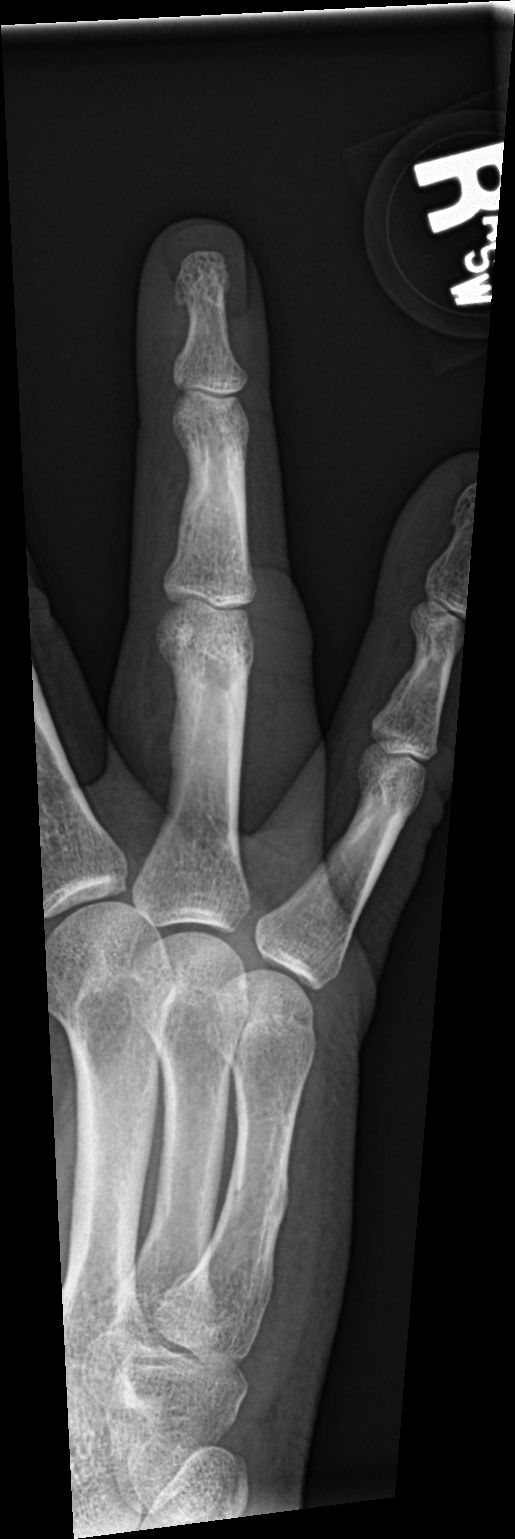

[finger lat]
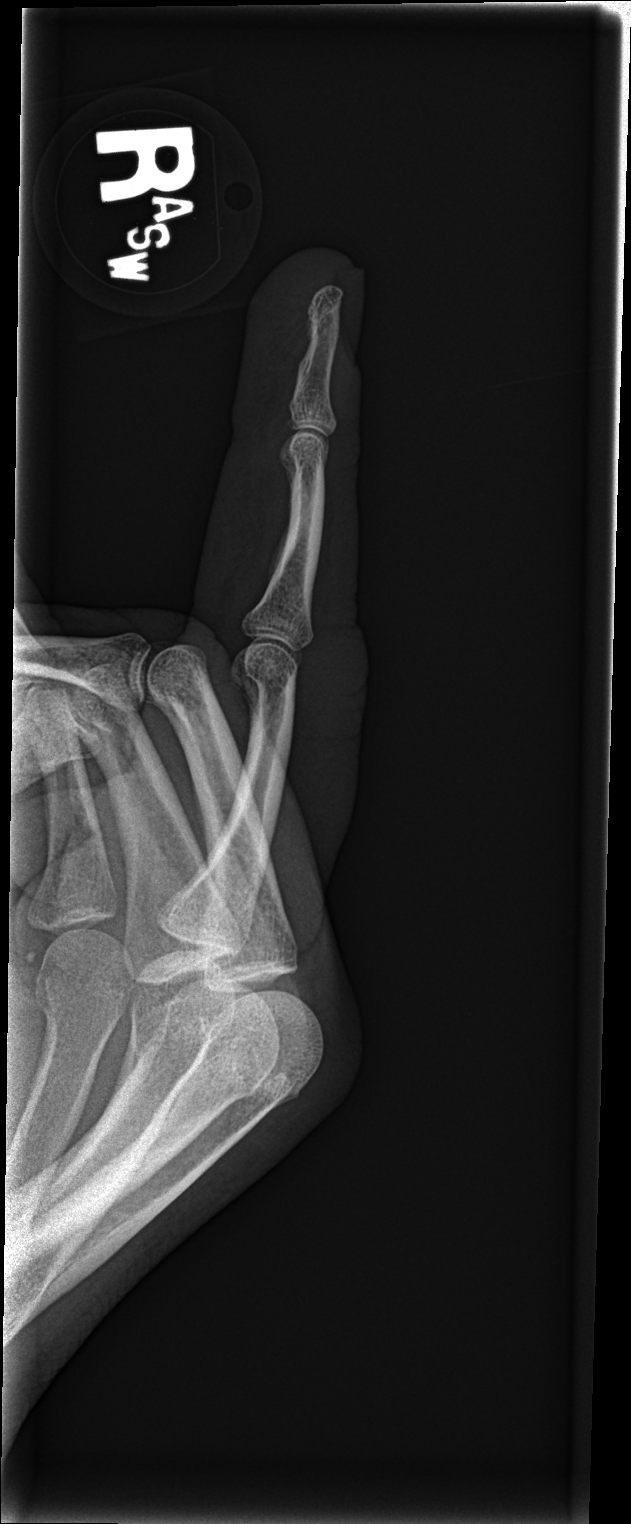

[3 of 3 positions shown; findings below may reference images not displayed]

FINDINGS: There is no evidence of fracture or dislocation. There is no
evidence of arthropathy or other focal bone abnormality. Soft tissue
swelling about the proximal fourth digit.
IMPRESSION: No evidence of arthropathy or other focal bone abnormality.

By: Isai Boyko M.D.

## 2019-04-29 ENCOUNTER — Inpatient Hospital Stay (HOSPITAL_COMMUNITY)
Admission: AD | Admit: 2019-04-29 | Payer: Self-pay | Source: Other Acute Inpatient Hospital | Admitting: Family Medicine

## 2019-04-29 DIAGNOSIS — R509 Fever, unspecified: Secondary | ICD-10-CM

## 2019-04-29 DIAGNOSIS — F23 Brief psychotic disorder: Secondary | ICD-10-CM

## 2019-04-29 DIAGNOSIS — R7989 Other specified abnormal findings of blood chemistry: Secondary | ICD-10-CM

## 2019-04-30 DIAGNOSIS — R7989 Other specified abnormal findings of blood chemistry: Secondary | ICD-10-CM | POA: Diagnosis not present

## 2019-04-30 DIAGNOSIS — F23 Brief psychotic disorder: Secondary | ICD-10-CM | POA: Diagnosis not present

## 2019-04-30 DIAGNOSIS — R509 Fever, unspecified: Secondary | ICD-10-CM | POA: Diagnosis not present

## 2019-05-01 DIAGNOSIS — R509 Fever, unspecified: Secondary | ICD-10-CM | POA: Diagnosis not present

## 2019-05-01 DIAGNOSIS — R7989 Other specified abnormal findings of blood chemistry: Secondary | ICD-10-CM | POA: Diagnosis not present

## 2019-05-01 DIAGNOSIS — F23 Brief psychotic disorder: Secondary | ICD-10-CM | POA: Diagnosis not present

## 2019-05-02 DIAGNOSIS — R509 Fever, unspecified: Secondary | ICD-10-CM | POA: Diagnosis not present

## 2019-05-02 DIAGNOSIS — F23 Brief psychotic disorder: Secondary | ICD-10-CM | POA: Diagnosis not present

## 2019-05-02 DIAGNOSIS — R7989 Other specified abnormal findings of blood chemistry: Secondary | ICD-10-CM | POA: Diagnosis not present

## 2019-05-03 DIAGNOSIS — R7989 Other specified abnormal findings of blood chemistry: Secondary | ICD-10-CM | POA: Diagnosis not present

## 2019-05-03 DIAGNOSIS — F23 Brief psychotic disorder: Secondary | ICD-10-CM | POA: Diagnosis not present

## 2019-05-03 DIAGNOSIS — R509 Fever, unspecified: Secondary | ICD-10-CM | POA: Diagnosis not present

## 2019-05-04 DIAGNOSIS — R509 Fever, unspecified: Secondary | ICD-10-CM | POA: Diagnosis not present

## 2019-05-04 DIAGNOSIS — R7989 Other specified abnormal findings of blood chemistry: Secondary | ICD-10-CM | POA: Diagnosis not present

## 2019-05-04 DIAGNOSIS — F23 Brief psychotic disorder: Secondary | ICD-10-CM | POA: Diagnosis not present

## 2019-05-05 DIAGNOSIS — R7989 Other specified abnormal findings of blood chemistry: Secondary | ICD-10-CM | POA: Diagnosis not present

## 2019-05-05 DIAGNOSIS — R509 Fever, unspecified: Secondary | ICD-10-CM | POA: Diagnosis not present

## 2019-05-05 DIAGNOSIS — F23 Brief psychotic disorder: Secondary | ICD-10-CM | POA: Diagnosis not present

## 2019-05-06 DIAGNOSIS — F23 Brief psychotic disorder: Secondary | ICD-10-CM | POA: Diagnosis not present

## 2019-05-06 DIAGNOSIS — R7989 Other specified abnormal findings of blood chemistry: Secondary | ICD-10-CM | POA: Diagnosis not present

## 2019-05-06 DIAGNOSIS — R509 Fever, unspecified: Secondary | ICD-10-CM | POA: Diagnosis not present
